# Patient Record
Sex: Male | Born: 1980 | Race: Black or African American | Hispanic: No | Marital: Single | Smoking: Never smoker
Health system: Southern US, Community
[De-identification: ages and names within clinical notes are randomized; demographics above are authoritative.]

## PROBLEM LIST (undated history)

## (undated) HISTORY — PX: BACK SURGERY: SHX140

---
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
## 2018-07-17 ENCOUNTER — Encounter (HOSPITAL_BASED_OUTPATIENT_CLINIC_OR_DEPARTMENT_OTHER): Payer: Self-pay | Admitting: Emergency Medicine

## 2018-07-17 ENCOUNTER — Other Ambulatory Visit: Payer: Self-pay

## 2018-07-17 ENCOUNTER — Emergency Department (HOSPITAL_BASED_OUTPATIENT_CLINIC_OR_DEPARTMENT_OTHER)
Admission: EM | Admit: 2018-07-17 | Discharge: 2018-07-17 | Disposition: A | Discharge status: Home / Self Care | Payer: Self-pay | Attending: Emergency Medicine | Admitting: Emergency Medicine

## 2018-07-17 DIAGNOSIS — Z202 Contact with and (suspected) exposure to infections with a predominantly sexual mode of transmission: Secondary | ICD-10-CM | POA: Insufficient documentation

## 2018-07-17 LAB — URINALYSIS, ROUTINE W REFLEX MICROSCOPIC
Bilirubin Urine: NEGATIVE
GLUCOSE, UA: NEGATIVE mg/dL
Hgb urine dipstick: NEGATIVE
Ketones, ur: NEGATIVE mg/dL
LEUKOCYTES UA: NEGATIVE
NITRITE: NEGATIVE
PROTEIN: NEGATIVE mg/dL
Specific Gravity, Urine: <1.005 — ABNORMAL LOW (ref 1.005–1.030)
pH: 6 (ref 5.0–8.0)

## 2018-07-17 MED ORDER — METRONIDAZOLE 500 MG PO TABS: 2000.0000 mg | ORAL_TABLET | Freq: Once | ORAL | 0 refills | Status: AC

## 2018-07-17 MED ORDER — METRONIDAZOLE 500 MG PO TABS: 2000.0000 mg | ORAL_TABLET | Freq: Once | ORAL | 0 refills | Status: DC

## 2018-07-17 MED ORDER — METRONIDAZOLE 500 MG PO TABS
2000.0000 mg | ORAL_TABLET | Freq: Once | ORAL | Status: DC
  Filled 2018-07-17: qty 4

## 2018-07-17 NOTE — ED Provider Notes (Signed)
[facility] EMERGENCY DEPARTMENT Provider Note   CSN: [id number] Arrival date & time: 07/17/18  1115     History   Chief Complaint Chief Complaint  Patient presents with  . Exposure to STD    HPI Raymond Oconnor is a 37 y.o. male presenting for trich exposure.   Patient states his partner was seen last week for vaginal discharge and diagnosed with trichomonas.  She was treated, and told him to come to the ER for evaluation/treatment.  Patient states he is having no symptoms.  He denies penile discharge, pain, or swelling.  He states he has been tested for STDs several years ago, has never had positive testing.  Patient states he has no medical problems, takes no medications daily.  He sexually active with one male partner.  HPI  History reviewed. No pertinent past medical history.  There are no active problems to display for this patient.   History reviewed. No pertinent surgical history.      Home Medications    Prior to Admission medications   Medication Sig Start Date End Date Taking? Authorizing Provider  metroNIDAZOLE (FLAGYL) 500 MG tablet Take 4 tablets (2,000 mg total) by mouth once for 1 dose. 07/17/18 07/17/18  Shelsie Tijerino, PA-C    Family History History reviewed. No pertinent family history.  Social History Social History   Tobacco Use  . Smoking status: Never Smoker  . Smokeless tobacco: Never Used  Substance Use Topics  . Alcohol use: Not on file  . Drug use: Not on file     Allergies   Patient has no known allergies.   Review of Systems Review of Systems  Constitutional: Negative for fever.  Genitourinary: Negative for discharge, penile pain and penile swelling.     Physical Exam Updated Vital Signs BP (!) 138/102 (BP Location: Left Arm)   Pulse 82   Temp 98 F (36.7 C) (Oral)   Resp 18   Ht 5\' 8"  (1.727 m)   Wt 113.4 kg   SpO2 98%   BMI 38.01 kg/m   Physical Exam  Constitutional: He is oriented to person,  place, and time. He appears well-developed and well-nourished. No distress.  HENT:  Head: Normocephalic and atraumatic.  Eyes: EOM are normal.  Neck: Normal range of motion.  Cardiovascular: Normal rate, regular rhythm and intact distal pulses.  Pulmonary/Chest: Effort normal and breath sounds normal. No respiratory distress. He has no wheezes.  Abdominal: Soft. He exhibits no distension. There is no tenderness.  Genitourinary: Testes normal and penis normal. Circumcised.  Genitourinary Comments: Chaperone present.  No swelling or tenderness.  No penile discharge.  No inguinal lymphadenopathy.  Musculoskeletal: Normal range of motion.  Lymphadenopathy: No inguinal adenopathy noted on the right or left side.  Neurological: He is alert and oriented to person, place, and time.  Skin: Skin is warm. Capillary refill takes less than 2 seconds. No rash noted.  Psychiatric: He has a normal mood and affect.  Nursing note and vitals reviewed.    ED Treatments / Results  Labs (all labs ordered are listed, but only abnormal results are displayed) Labs Reviewed  URINALYSIS, ROUTINE W REFLEX MICROSCOPIC - Abnormal; Notable for the following components:      Result Value   Specific Gravity, Urine <1.005 (*)    All other components within normal limits  GC/CHLAMYDIA PROBE AMP ([place]) NOT AT [facility]    EKG None  Radiology No results found.  Procedures Procedures (including critical care time)  Medications Ordered in ED Medications  metroNIDAZOLE (FLAGYL) tablet 2,000 mg (0 mg Oral Hold 07/17/18 1311)     Initial Impression / Assessment and Plan / ED Course  I have reviewed the triage vital signs and the nursing notes.  Pertinent labs & imaging results that were available during my care of the patient were reviewed by me and considered in my medical decision making (see chart for details).     Presenting for evaluation of STD testing after his partner tested positive trick.   Physical exam without discharge.  Gonorrhea chlamydia sent.  Urine obtained for further evaluation.  Urine without obvious trigger.  Discussed findings with patient.  Patient requesting treatment for possible trich exposure. Will give 2 g flagyl. Pt aware other tests are pending.   Patient informed RN that he has had alcohol today.  Will call in 1 time dose for tomorrow, patient instructed not to drink alcohol. At this time, pt appears safe for d/c. Return precautions given. Pt states he understands and agrees to plan.    Final Clinical Impressions(s) / ED Diagnoses   Final diagnoses:  Trichomonas exposure    ED Discharge Orders         Ordered    metroNIDAZOLE (FLAGYL) 500 MG tablet   Once,   Status:  Discontinued     07/17/18 1311    metroNIDAZOLE (FLAGYL) 500 MG tablet   Once     07/17/18 1313           Azucena Dart, PA-C 07/17/18 1324    Gwyneth Sprout, MD 07/17/18 1510

## 2018-07-17 NOTE — Discharge Instructions (Addendum)
You were treated for trichomonas today.  Follow-up with the health department for any further concerns for STDs. Return to the emergency room with any new, worsening, concerning symptoms.

## 2018-07-17 NOTE — ED Triage Notes (Signed)
Patient states that his girlfriend is positive for Trich. Denies any S/S

## 2018-07-18 LAB — GC/CHLAMYDIA PROBE AMP (~~LOC~~) NOT AT ARMC
CHLAMYDIA, DNA PROBE: NEGATIVE
Neisseria Gonorrhea: NEGATIVE

## 2019-02-21 ENCOUNTER — Other Ambulatory Visit: Payer: Self-pay

## 2019-02-21 ENCOUNTER — Encounter (HOSPITAL_BASED_OUTPATIENT_CLINIC_OR_DEPARTMENT_OTHER): Payer: Self-pay

## 2019-02-21 ENCOUNTER — Emergency Department (HOSPITAL_BASED_OUTPATIENT_CLINIC_OR_DEPARTMENT_OTHER)
Admission: EM | Admit: 2019-02-21 | Discharge: 2019-02-21 | Disposition: A | Discharge status: Home / Self Care | Payer: Self-pay | Attending: Emergency Medicine | Admitting: Emergency Medicine

## 2019-02-21 DIAGNOSIS — J302 Other seasonal allergic rhinitis: Secondary | ICD-10-CM | POA: Insufficient documentation

## 2019-02-21 NOTE — ED Provider Notes (Signed)
[facility] EMERGENCY DEPARTMENT Provider Note   CSN: [id number] Arrival date & time: 02/21/19  1147    History   Chief Complaint Chief Complaint  Patient presents with  . Allergic Reaction    HPI Raymond Oconnor is a 38 y.o. male.     38yo M who p/w allergies.  She states that over the past week he has been having itching and swelling of his eyes associated with mild nasal congestion and scratchy throat.  These are his usual allergy symptoms.  Yesterday, his symptoms were more severe and his work noticed his swollen eyes and sent him home, told him that he cannot return until he was evaluated.  He took Benadryl and slept, states that symptoms were resolved when he woke up.  Today he denies any symptoms.  He has had no associated cough, fevers, SOB, or other complaints.  No sick contacts or recent travel.  The history is provided by the patient.  Allergic Reaction    History reviewed. No pertinent past medical history.  There are no active problems to display for this patient.   History reviewed. No pertinent surgical history.      Home Medications    Prior to Admission medications   Not on File    Family History History reviewed. No pertinent family history.  Social History Social History   Tobacco Use  . Smoking status: Never Smoker  . Smokeless tobacco: Never Used  Substance Use Topics  . Alcohol use: Not on file  . Drug use: Not on file     Allergies   Patient has no known allergies.   Review of Systems Review of Systems All other systems reviewed and are negative except that which was mentioned in HPI   Physical Exam Updated Vital Signs BP (!) 142/80 (BP Location: Right Arm)   Pulse 62   Temp 98 F (36.7 C) (Oral)   Resp 16   Ht 5\' 8"  (1.727 m)   Wt 113.4 kg   SpO2 100%   BMI 38.01 kg/m   Physical Exam Vitals signs and nursing note reviewed.  Constitutional:      General: He is not in acute distress.    Appearance: He is  well-developed.  HENT:     Head: Normocephalic and atraumatic.     Nose: Nose normal. No rhinorrhea.     Mouth/Throat:     Mouth: Mucous membranes are moist.     Pharynx: Oropharynx is clear. No oropharyngeal exudate or posterior oropharyngeal erythema.  Eyes:     Conjunctiva/sclera: Conjunctivae normal.  Neck:     Musculoskeletal: Neck supple.  Pulmonary:     Effort: Pulmonary effort is normal.  Skin:    General: Skin is warm and dry.  Neurological:     Mental Status: He is alert and oriented to person, place, and time.  Psychiatric:        Judgment: Judgment normal.      ED Treatments / Results  Labs (all labs ordered are listed, but only abnormal results are displayed) Labs Reviewed - No data to display  EKG None  Radiology No results found.  Procedures Procedures (including critical care time)  Medications Ordered in ED Medications - No data to display   Initial Impression / Assessment and Plan / ED Course  I have reviewed the triage vital signs and the nursing notes.  Pertinent labs & imaging results that were available during my care of the patient were reviewed by me and considered  in my medical decision making (see chart for details).       Sx c/w seasonal allergies, no ongoing sx to suggest viral URI such as COVID-19 and no sx suggestive of allergic reaction or anaphylaxis. Discussed zyrtec/claritin use daily to control allergy symptoms.  Raymond SierrasMarcus Guerrera was evaluated in Emergency Department on 02/21/2019 for the symptoms described in the history of present illness. He was evaluated in the context of the global COVID-19 pandemic, which necessitated consideration that the patient might be at risk for infection with the SARS-CoV-2 virus that causes COVID-19. Institutional protocols and algorithms that pertain to the evaluation of patients at risk for COVID-19 are in a state of rapid change based on information released by regulatory bodies including the CDC and  federal and state organizations. These policies and algorithms were followed during the patient's care in the ED.   Final Clinical Impressions(s) / ED Diagnoses   Final diagnoses:  Seasonal allergies    ED Discharge Orders    None       Little, Ambrose Finlandachel Morgan, MD 02/21/19 1229

## 2019-02-21 NOTE — ED Triage Notes (Signed)
Yesterday at work pt had swelling to face and itchy throat, took benadryl and went home to rest, per pt symptoms resolved however work would not permit back to work without medical clearance.  Pt states only symptom remaining is itchy throat.

## 2020-03-06 ENCOUNTER — Encounter (HOSPITAL_BASED_OUTPATIENT_CLINIC_OR_DEPARTMENT_OTHER): Payer: Self-pay | Admitting: *Deleted

## 2020-03-06 ENCOUNTER — Emergency Department (HOSPITAL_BASED_OUTPATIENT_CLINIC_OR_DEPARTMENT_OTHER)
Admission: EM | Admit: 2020-03-06 | Discharge: 2020-03-06 | Disposition: A | Discharge status: Home / Self Care | Payer: Self-pay | Attending: Emergency Medicine | Admitting: Emergency Medicine

## 2020-03-06 ENCOUNTER — Other Ambulatory Visit: Payer: Self-pay

## 2020-03-06 DIAGNOSIS — R197 Diarrhea, unspecified: Secondary | ICD-10-CM | POA: Insufficient documentation

## 2020-03-06 DIAGNOSIS — R1084 Generalized abdominal pain: Secondary | ICD-10-CM | POA: Insufficient documentation

## 2020-03-06 DIAGNOSIS — Z20822 Contact with and (suspected) exposure to covid-19: Secondary | ICD-10-CM | POA: Insufficient documentation

## 2020-03-06 LAB — SARS CORONAVIRUS 2 BY RT PCR (HOSPITAL ORDER, PERFORMED IN ~~LOC~~ HOSPITAL LAB): SARS Coronavirus 2: NEGATIVE

## 2020-03-06 MED ORDER — ONDANSETRON 4 MG PO TBDP: 4.0000 mg | ORAL_TABLET | Freq: Three times a day (TID) | ORAL | 0 refills | Status: AC | PRN

## 2020-03-06 MED ORDER — AMOXICILLIN-POT CLAVULANATE 875-125 MG PO TABS: 1.0000 | ORAL_TABLET | Freq: Two times a day (BID) | ORAL | 0 refills | Status: AC

## 2020-03-06 NOTE — ED Triage Notes (Signed)
States left work last pm due to abdominal cramps and diarrhea
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
## 2020-06-17 ENCOUNTER — Other Ambulatory Visit: Payer: Self-pay

## 2020-06-17 ENCOUNTER — Encounter (HOSPITAL_BASED_OUTPATIENT_CLINIC_OR_DEPARTMENT_OTHER): Payer: Self-pay | Admitting: *Deleted

## 2020-06-17 ENCOUNTER — Emergency Department (HOSPITAL_BASED_OUTPATIENT_CLINIC_OR_DEPARTMENT_OTHER)
Admission: EM | Admit: 2020-06-17 | Discharge: 2020-06-17 | Disposition: A | Discharge status: Home / Self Care | Payer: HRSA Program | Attending: Emergency Medicine | Admitting: Emergency Medicine

## 2020-06-17 DIAGNOSIS — Z79899 Other long term (current) drug therapy: Secondary | ICD-10-CM | POA: Diagnosis not present

## 2020-06-17 DIAGNOSIS — U071 COVID-19: Secondary | ICD-10-CM | POA: Diagnosis not present

## 2020-06-17 DIAGNOSIS — R05 Cough: Secondary | ICD-10-CM | POA: Diagnosis present

## 2020-06-17 LAB — SARS CORONAVIRUS 2 BY RT PCR (HOSPITAL ORDER, PERFORMED IN ~~LOC~~ HOSPITAL LAB): SARS Coronavirus 2: POSITIVE — AB

## 2020-06-17 NOTE — Discharge Instructions (Signed)
Thank you for allowing us to care for you today.   Please return to the emergency department if you have any new or worsening symptoms.  You tested positive for covid-19 today.   Medications- You can take medications to help treat your symptoms: -Tylenol for fever and body aches. Please take as prescribed on the bottle. -Over the coutner cough medicine such as mucinex, robitussin, or other brands. -Flonase or saline nasal spray for nasal congestion -Vitamins as recommended by CDC  Treatment- This is a virus and unfortunately there are no antibitotics approved to treat this virus at this time. It is important to monitor your symptoms closely: -You should have a theremometer at home to check your temperature when feeling feverish. -Use a pulse ox meter to measure your oxygen when feeling short of breath.  -If your fever is over 100.4 despite taking tylenol or if your oxygen level drops below 94% these are reasons to rturn to the emergency department for further evaluation. Please call the emergency department before you come to make us aware.    We recommend you self-isolate for 10 days and to inform your work/family/friends that you has the virus.  They will need to self-quarantine for 14 days to monitor for symptoms.    Again: symptoms of shortness of breath, chest pain, difficulty breathing, new onset of confusion, any symptoms that are concerning. If any of these symptoms you should come to emergency department for evaluation.   I hope you feel better soon  

## 2020-06-17 NOTE — ED Triage Notes (Signed)
Pt requesting covid test. Denies SOB, fever. States that his girlfriend has covid. Reports no taste or smell that started today.

## 2020-06-17 NOTE — ED Provider Notes (Signed)
[facility] EMERGENCY DEPARTMENT Provider Note   CSN: [id number] Arrival date & time: 06/17/20  1914     History Chief Complaint  Patient presents with  . Covid Exposure    Raymond Oconnor is a 39 y.o. male with no contributory past medical history. Patient did not receive covid vaccinations.  HPI Patient presents to emergency room today with chief complaint of Covid exposure.  His significant other who he lives with tested positive for Covid yesterday.  Patient is endorsing loss of sense of taste and smell.  He had a cough x1 week ago.  Cough is nonproductive and has been improving since onset.  He has not been taking any medications for his symptoms prior to arrival.  He is here requesting a COVID test.  He denies any fever, chills, cough, shortness of breath, chest pain abdominal pain, nausea, vomiting, urinary symptoms, diarrhea, rash.    History reviewed. No pertinent past medical history.  There are no problems to display for this patient.   Past Surgical History:  Procedure Laterality Date  . BACK SURGERY         History reviewed. No pertinent family history.  Social History   Tobacco Use  . Smoking status: Never Smoker  . Smokeless tobacco: Never Used  Substance Use Topics  . Alcohol use: Not on file    Comment: twice a week  . Drug use: Yes    Types: Marijuana    Home Medications Prior to Admission medications   Medication Sig Start Date End Date Taking? Authorizing Provider  amoxicillin-clavulanate (AUGMENTIN) 875-125 MG tablet Take 1 tablet by mouth every 12 (twelve) hours. 03/06/20   Alvira Monday, MD  ondansetron (ZOFRAN ODT) 4 MG disintegrating tablet Take 1 tablet (4 mg total) by mouth every 8 (eight) hours as needed for nausea or vomiting. 03/06/20   Alvira Monday, MD    Allergies    Patient has no known allergies.  Review of Systems   Review of Systems  All other systems are reviewed and are negative for acute change except as  noted in the HPI.   Physical Exam Updated Vital Signs BP 117/67 (BP Location: Right Arm)   Pulse 77   Temp 98.5 F (36.9 C) (Oral)   Resp 16   Ht 5\' 8"  (1.727 m)   Wt 97.5 kg   SpO2 95%   BMI 32.69 kg/m   Physical Exam Vitals and nursing note reviewed.  Constitutional:      Appearance: He is well-developed. He is not ill-appearing or toxic-appearing.  HENT:     Head: Normocephalic and atraumatic.     Nose: Nose normal.     Mouth/Throat:     Mouth: Mucous membranes are moist.     Pharynx: Oropharynx is clear. No oropharyngeal exudate or posterior oropharyngeal erythema.  Eyes:     General: No scleral icterus.       Right eye: No discharge.        Left eye: No discharge.     Conjunctiva/sclera: Conjunctivae normal.  Neck:     Vascular: No JVD.  Cardiovascular:     Rate and Rhythm: Normal rate and regular rhythm.     Pulses: Normal pulses.     Heart sounds: Normal heart sounds.  Pulmonary:     Effort: Pulmonary effort is normal. No respiratory distress.     Breath sounds: Normal breath sounds. No wheezing, rhonchi or rales.     Comments: Oxygen saturation is 95% on room air. Chest:  Chest wall: No tenderness.  Abdominal:     General: There is no distension.     Palpations: Abdomen is soft.  Musculoskeletal:        General: Normal range of motion.     Cervical back: Normal range of motion.  Skin:    General: Skin is warm and dry.     Findings: No rash.  Neurological:     Mental Status: He is oriented to person, place, and time.     GCS: GCS eye subscore is 4. GCS verbal subscore is 5. GCS motor subscore is 6.     Comments: Fluent speech, no facial droop.  Psychiatric:        Behavior: Behavior normal.     ED Results / Procedures / Treatments   Labs (all labs ordered are listed, but only abnormal results are displayed) Labs Reviewed  SARS CORONAVIRUS 2 BY RT PCR (HOSPITAL ORDER, PERFORMED IN [facility]) - Abnormal; Notable for the following  components:      Result Value   SARS Coronavirus 2 POSITIVE (*)    All other components within normal limits    EKG None  Radiology No results found.  Procedures Procedures (including critical care time)  Medications Ordered in ED Medications - No data to display  ED Course  I have reviewed the triage vital signs and the nursing notes.  Pertinent labs & imaging results that were available during my care of the patient were reviewed by me and considered in my medical decision making (see chart for details).    MDM Rules/Calculators/A&P                          History provided by patient with additional history obtained from chart review.    Symptoms and exam most suggestive of uncomplicated viral illness. DDX incluldes viral URI/LRI, COVID-19.  No travel. He has known exposures to confirmed COVID-19.  Exam is benign.  Normal WOB. No fever, tachypnea, tachycardia, hypoxemia. Lungs are CTAB. I do not think that a CXR is indicated at this time as VS are WNL, there are no signs of consolidation on auscultation and there is no hypoxia, increased WOB or other concerning features to exam. No significant h/o immunocompromise. Doubt bacterial bronchitis or pneumonia.  No signs or symptoms to suggest strep pharyngitis.  No clinical signs of severe illness, dehydration, to warrant further emergent work up in ER. Covid test is positive   Given reassuring physical exam, symptoms, will discharge with symptomatic treatment. Recommend telemedicine PCP f/u in the next 2-3 days for persistent symptoms  for further guidance. Self-isolation instructions discussed. Pt was given home self-isolation instructions and instructions for family members.   Pt understands signs and symptoms that would warrant return to ED.  Pt comfortable and agreeable with POC.   Corrigan Kretschmer was evaluated in Emergency Department on 06/17/2020 for the symptoms described in the history of present illness. He was evaluated in  the context of the global COVID-19 pandemic, which necessitated consideration that the patient might be at risk for infection with the SARS-CoV-2 virus that causes COVID-19. Institutional protocols and algorithms that pertain to the evaluation of patients at risk for COVID-19 are in a state of rapid change based on information released by regulatory bodies including the CDC and federal and state organizations. These policies and algorithms were followed during the patient's care in the ED.   Portions of this note were generated with Dragon  dictation software. Dictation errors may occur despite best attempts at proofreading.   Final Clinical Impression(s) / ED Diagnoses Final diagnoses:  COVID-19    Rx / DC Orders ED Discharge Orders    None       Sherene Sires, PA-C 06/17/20 2250    Milagros Loll, MD 06/18/20 1616

## 2021-07-22 ENCOUNTER — Emergency Department (HOSPITAL_BASED_OUTPATIENT_CLINIC_OR_DEPARTMENT_OTHER)
Admission: EM | Admit: 2021-07-22 | Discharge: 2021-07-22 | Disposition: A | Discharge status: Home / Self Care | Payer: Self-pay | Attending: Emergency Medicine | Admitting: Emergency Medicine

## 2021-07-22 ENCOUNTER — Encounter (HOSPITAL_BASED_OUTPATIENT_CLINIC_OR_DEPARTMENT_OTHER): Payer: Self-pay | Admitting: Emergency Medicine

## 2021-07-22 ENCOUNTER — Other Ambulatory Visit: Payer: Self-pay

## 2021-07-22 ENCOUNTER — Emergency Department (HOSPITAL_BASED_OUTPATIENT_CLINIC_OR_DEPARTMENT_OTHER): Payer: Self-pay

## 2021-07-22 DIAGNOSIS — R519 Headache, unspecified: Secondary | ICD-10-CM | POA: Insufficient documentation

## 2021-07-22 DIAGNOSIS — J029 Acute pharyngitis, unspecified: Secondary | ICD-10-CM | POA: Insufficient documentation

## 2021-07-22 DIAGNOSIS — Z20822 Contact with and (suspected) exposure to covid-19: Secondary | ICD-10-CM | POA: Insufficient documentation

## 2021-07-22 DIAGNOSIS — H9209 Otalgia, unspecified ear: Secondary | ICD-10-CM | POA: Insufficient documentation

## 2021-07-22 DIAGNOSIS — G43909 Migraine, unspecified, not intractable, without status migrainosus: Secondary | ICD-10-CM | POA: Insufficient documentation

## 2021-07-22 LAB — RESP PANEL BY RT-PCR (FLU A&B, COVID) ARPGX2
Influenza A by PCR: NEGATIVE
Influenza B by PCR: NEGATIVE
SARS Coronavirus 2 by RT PCR: NEGATIVE

## 2021-07-22 MED ORDER — DIPHENHYDRAMINE HCL 50 MG/ML IJ SOLN
25.0000 mg | Freq: Once | INTRAMUSCULAR | Status: AC
  Administered 2021-07-22: 25 mg via INTRAVENOUS
  Filled 2021-07-22: qty 1

## 2021-07-22 MED ORDER — PROCHLORPERAZINE EDISYLATE 10 MG/2ML IJ SOLN
10.0000 mg | Freq: Once | INTRAMUSCULAR | Status: AC
  Administered 2021-07-22: 10 mg via INTRAVENOUS
  Filled 2021-07-22: qty 2

## 2021-07-22 MED ORDER — ACETAMINOPHEN 500 MG PO TABS
1000.0000 mg | ORAL_TABLET | Freq: Once | ORAL | Status: AC
  Administered 2021-07-22: 1000 mg via ORAL
  Filled 2021-07-22: qty 2

## 2021-07-22 NOTE — ED Provider Notes (Signed)
[facility] EMERGENCY DEPARTMENT Provider Note   CSN: [id number] Arrival date & time: 07/22/21  0316     History Chief Complaint  Patient presents with   Migraine    Raymond Oconnor is a 40 y.o. male.  Presented to ER with concern for headache.  Patient reports that yesterday he noted mild sore throat, earache, feeling generally unwell but then this morning woke up with a headache.  Front and back of head, base of head near neck.  Denies neck stiffness.  Is able to move his neck.  No fevers or chills.  No vision changes.  His sore throat is actually improved today.  No cough or difficulty breathing.  Denies any chronic medical problems.  Headache was not sudden onset and worsening of his life.  Currently moderate.  HPI     History reviewed. No pertinent past medical history.  There are no problems to display for this patient.   Past Surgical History:  Procedure Laterality Date   BACK SURGERY         History reviewed. No pertinent family history.  Social History   Tobacco Use   Smoking status: Never   Smokeless tobacco: Never  Substance Use Topics   Drug use: Yes    Types: Marijuana    Home Medications Prior to Admission medications   Medication Sig Start Date End Date Taking? Authorizing Provider  amoxicillin-clavulanate (AUGMENTIN) 875-125 MG tablet Take 1 tablet by mouth every 12 (twelve) hours. 03/06/20   Alvira Monday, MD  ondansetron (ZOFRAN ODT) 4 MG disintegrating tablet Take 1 tablet (4 mg total) by mouth every 8 (eight) hours as needed for nausea or vomiting. 03/06/20   Alvira Monday, MD    Allergies    Patient has no known allergies.  Review of Systems   Review of Systems  Constitutional:  Negative for chills and fever.  HENT:  Positive for ear pain and sore throat.   Eyes:  Negative for pain and visual disturbance.  Respiratory:  Negative for cough and shortness of breath.   Cardiovascular:  Negative for chest pain and palpitations.   Gastrointestinal:  Negative for abdominal pain and vomiting.  Genitourinary:  Negative for dysuria and hematuria.  Musculoskeletal:  Negative for arthralgias and back pain.  Skin:  Negative for color change and rash.  Neurological:  Positive for headaches. Negative for seizures and syncope.  All other systems reviewed and are negative.  Physical Exam Updated Vital Signs BP (!) 94/54 (BP Location: Right Arm)   Pulse 75   Temp 98 F (36.7 C) (Oral)   Resp 16   Ht 5\' 8"  (1.727 m)   Wt 113.4 kg   SpO2 100%   BMI 38.01 kg/m   Physical Exam Vitals and nursing note reviewed.  Constitutional:      Appearance: He is well-developed.  HENT:     Head: Normocephalic and atraumatic.  Eyes:     Conjunctiva/sclera: Conjunctivae normal.  Neck:     Comments: No neck rigidity Cardiovascular:     Rate and Rhythm: Normal rate and regular rhythm.     Heart sounds: No murmur heard. Pulmonary:     Effort: Pulmonary effort is normal. No respiratory distress.     Breath sounds: Normal breath sounds.  Abdominal:     Palpations: Abdomen is soft.     Tenderness: There is no abdominal tenderness.  Musculoskeletal:        General: No deformity or signs of injury.     Cervical  back: Normal range of motion and neck supple. No rigidity or tenderness.  Skin:    General: Skin is warm and dry.  Neurological:     General: No focal deficit present.     Mental Status: He is alert.  Psychiatric:        Mood and Affect: Mood normal.        Thought Content: Thought content normal.    ED Results / Procedures / Treatments   Labs (all labs ordered are listed, but only abnormal results are displayed) Labs Reviewed  RESP PANEL BY RT-PCR (FLU A&B, COVID) [id number]    EKG None  Radiology CT HEAD WO CONTRAST ( )  Result Date: 07/22/2021 CLINICAL DATA:  Sudden onset severe headache. EXAM: CT HEAD WITHOUT CONTRAST TECHNIQUE: Contiguous axial images were obtained from the base of the skull through the  vertex without intravenous contrast. COMPARISON:  None. FINDINGS: Brain: There is no evidence of an acute infarct, intracranial hemorrhage, mass, midline shift, or extra-axial fluid collection. The ventricles and sulci are normal. Vascular: No hyperdense vessel. Skull: No fracture or suspicious osseous lesion. Sinuses/Orbits: Partially visualized mild mucosal thickening inferiorly in the left frontal sinus. Clear mastoid air cells. Other: None. IMPRESSION: Negative head CT. Electronically Signed   By: Sebastian Ache M.D.   On: 07/22/2021 07:58    Procedures Procedures   Medications Ordered in ED Medications  prochlorperazine (COMPAZINE) injection 10 mg (10 mg Intravenous Given 07/22/21 0830)  diphenhydrAMINE (BENADRYL) injection 25 mg (25 mg Intravenous Given 07/22/21 0827)  acetaminophen (TYLENOL) tablet 1,000 mg (1,000 mg Oral Given 07/22/21 0093)    ED Course  I have reviewed the triage vital signs and the nursing notes.  Pertinent labs & imaging results that were available during my care of the patient were reviewed by me and considered in my medical decision making (see chart for details).    MDM Rules/Calculators/A&P                           50 - year-old male presents to ER with concern for bad headache.  On exam he appears well in no distress.  CT head negative.  Provided headache cocktail.  On reassessment, his headache had resolved.  Given his well appearance, improvement in symptoms, reassuring work-up, believe he is stable for discharge.  COVID-negative.   After the discussed management above, the patient was determined to be safe for discharge.  The patient was in agreement with this plan and all questions regarding their care were answered.  ED return precautions were discussed and the patient will return to the ED with any significant worsening of condition.  Final Clinical Impression(s) / ED Diagnoses Final diagnoses:  Bad headache    Rx / DC Orders ED Discharge Orders      None        Milagros Loll, MD 07/23/21 [phone]

## 2021-07-22 NOTE — Discharge Instructions (Addendum)
Take Tylenol or Motrin as needed for pain control.  Follow-up with your primary care doctor.  If you develop worsening headache, vomiting, fever or other new concerning symptom, come back to ER for reassessment.

## 2021-07-22 NOTE — ED Notes (Signed)
Patient reports headache and sore throat since yesterday.

## 2021-07-22 NOTE — ED Triage Notes (Signed)
Pt states had to leave work yesterday about 9 am with sore throat and earache, pt went home to attempt to sleep and woke up with migraine, unable to move neck.

## 2022-09-22 IMAGING — CT CT HEAD W/O CM
4 series · 16 of 47 positions shown, 18 images · non-contrast
Comparison: None.

CLINICAL DATA: Sudden onset severe headache.

EXAM:
CT HEAD WITHOUT CONTRAST
TECHNIQUE: Contiguous axial images were obtained from the base of the skull
through the vertex without intravenous contrast.

[Series 2: head wo · axial · 0.42mm/px · z∈[-126,-16]mm · 7 of 30 slices shown, 9 images]
[im 4/30  brain]
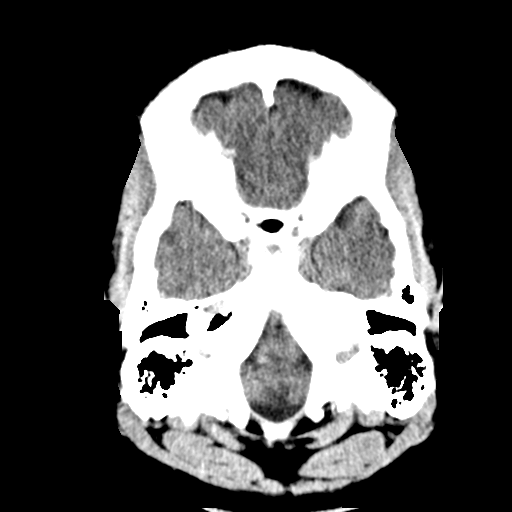
[im 4/30  bone]
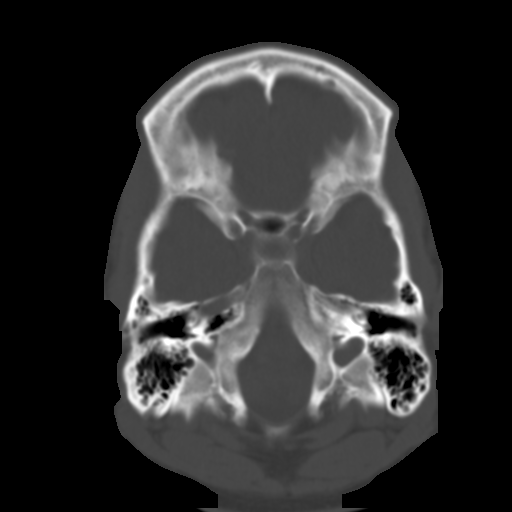
[im 8/30  brain]
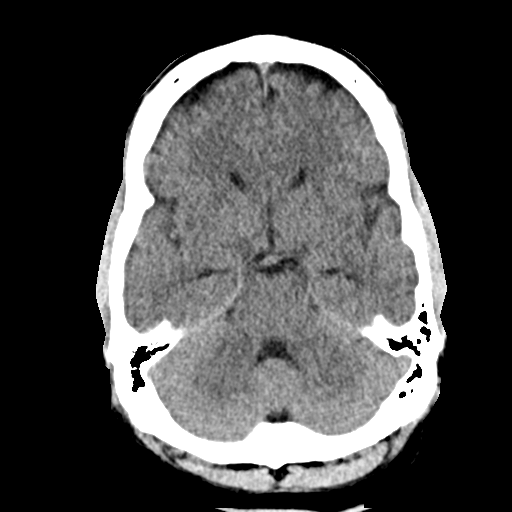
[im 11/30  brain]
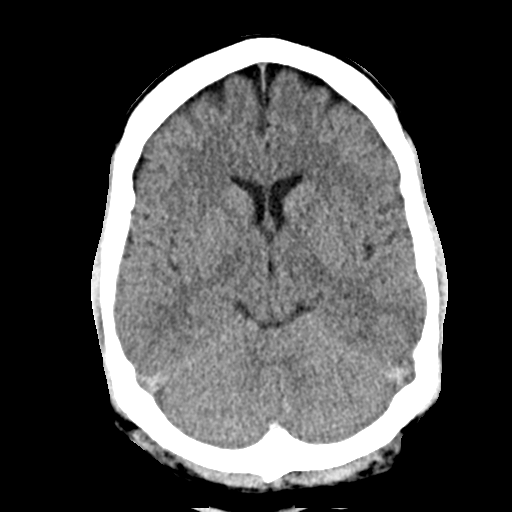
[im 15/30  brain]
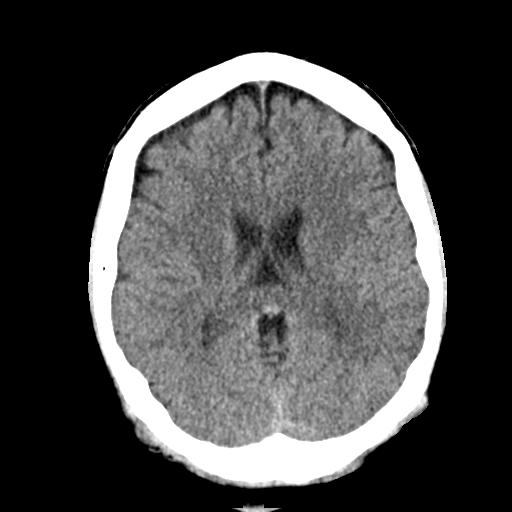
[im 19/30  brain]
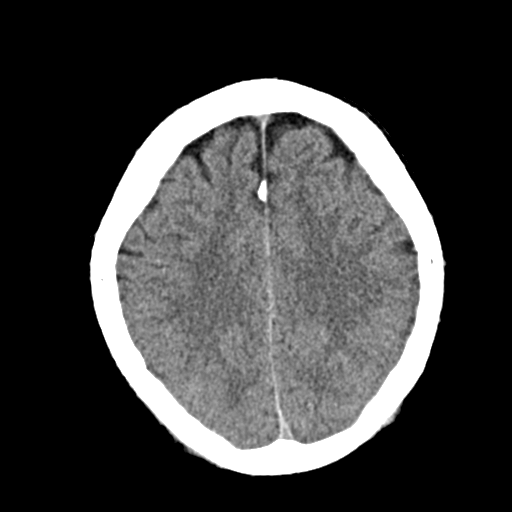
[im 19/30  bone]
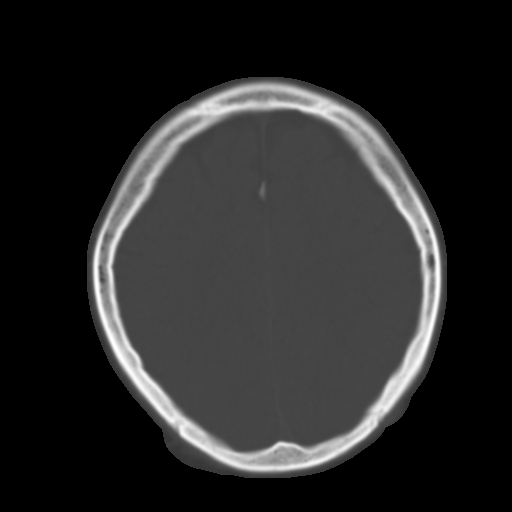
[im 22/30  brain]
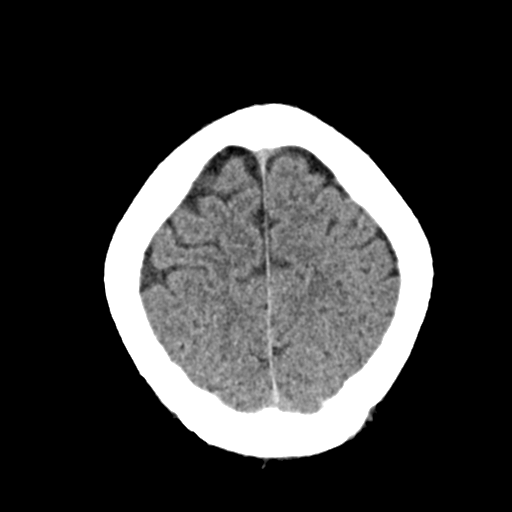
[im 26/30  brain]
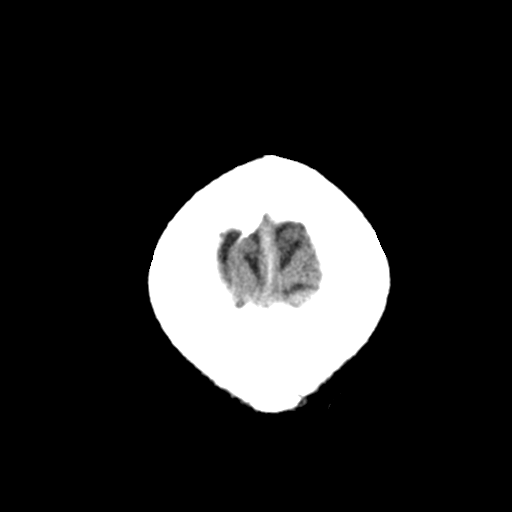

[Series 3: head bone · axial · 0.42mm/px · z∈[-128,-98]mm · 3 of 75 slices shown]
[im 8/75  bone]
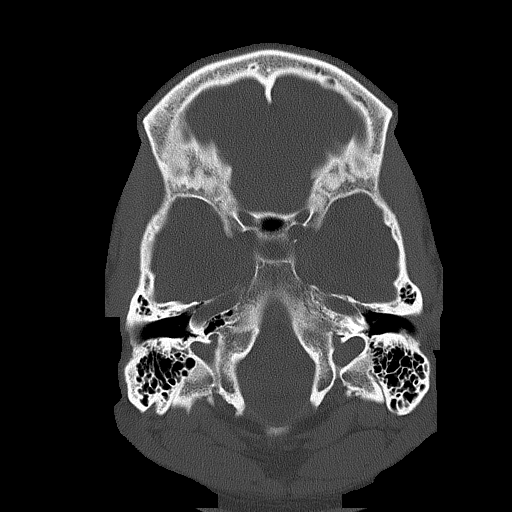
[im 15/75  bone]
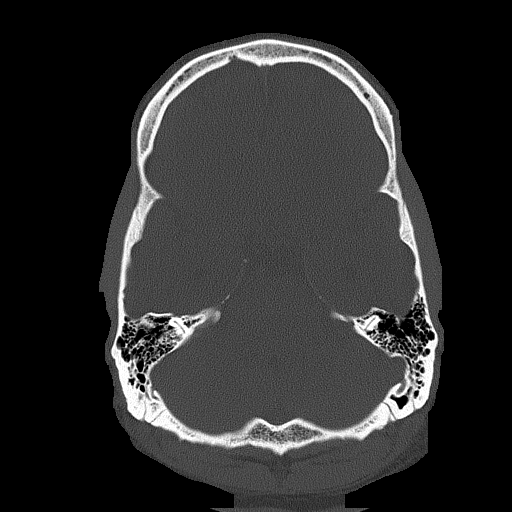
[im 23/75  bone]
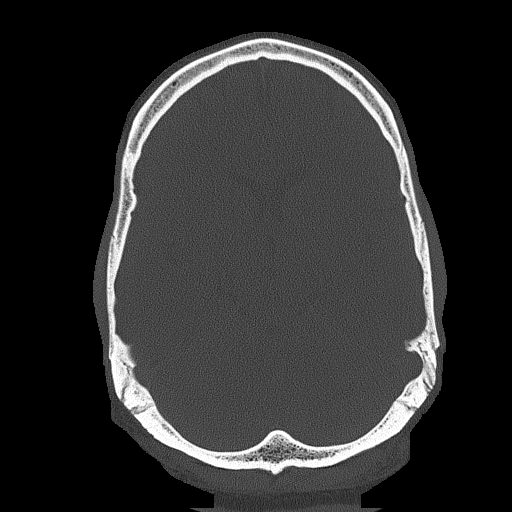

[Series 4: coronal soft · coronal · 0.29mm/px · 3 of 69 slices shown]
[im 23/69  brain]
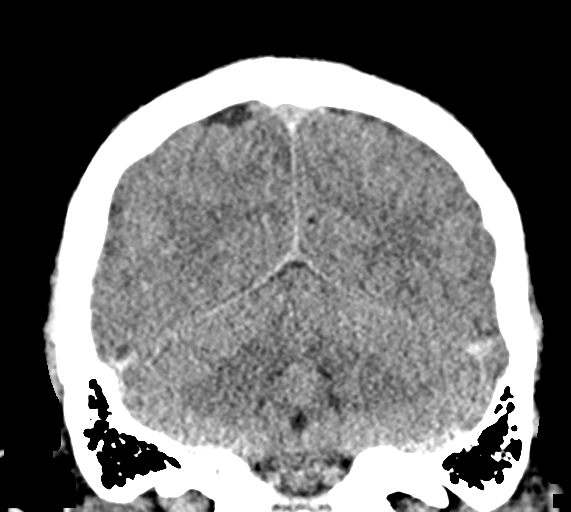
[im 31/69  brain]
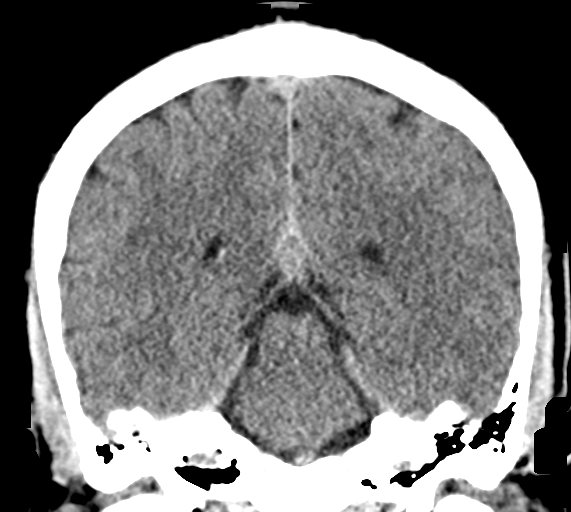
[im 38/69  brain]
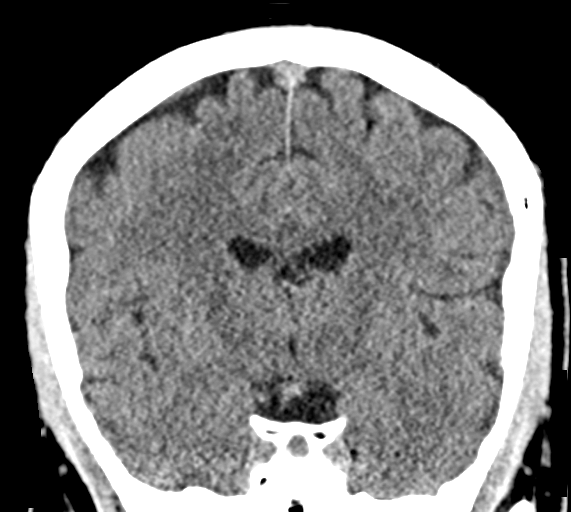

[Series 5: sag soft · sagittal · 0.29mm/px · 3 of 59 slices shown]
[im 20/59  brain]
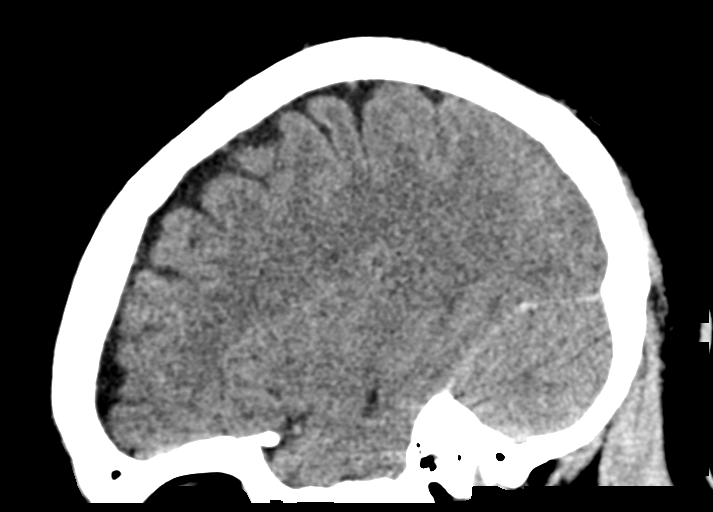
[im 30/59  brain]
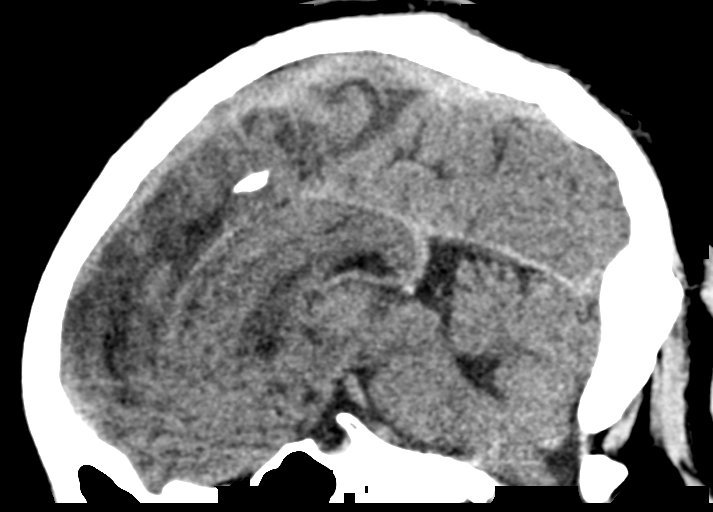
[im 39/59  brain]
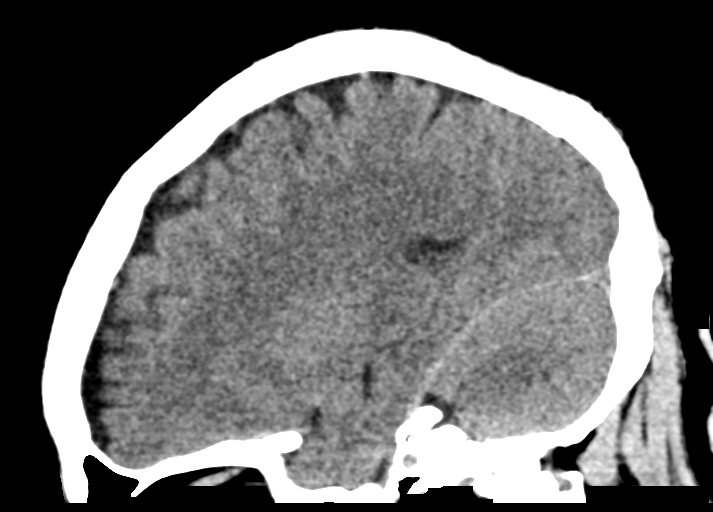

[16 of 47 positions shown; findings below may reference images not displayed]

FINDINGS: Brain: There is no evidence of an acute infarct, intracranial
hemorrhage, mass, midline shift, or extra-axial fluid collection.
The ventricles and sulci are normal.

Vascular: No hyperdense vessel.

Skull: No fracture or suspicious osseous lesion.

Sinuses/Orbits: Partially visualized mild mucosal thickening
inferiorly in the left frontal sinus. Clear mastoid air cells.

Other: None.
IMPRESSION: Negative head CT.
